# Patient Record
Sex: Male | Born: 2000 | Race: Black or African American | Hispanic: No | Marital: Single | Smoking: Never smoker
Health system: Southern US, Community
[De-identification: ages and names within clinical notes are randomized; demographics above are authoritative.]

---
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
## 2023-05-09 ENCOUNTER — Ambulatory Visit
Admission: EM | Admit: 2023-05-09 | Discharge: 2023-05-09 | Discharge status: Against Medical Advice | Payer: MEDICAID | Attending: Emergency Medicine | Admitting: Emergency Medicine

## 2023-05-09 NOTE — ED Notes (Signed)
 Called pt from lobby x2 & phone x2 unable to reach nor lvm. LWBS before triage.

## 2023-05-12 NOTE — ED Provider Notes (Signed)
 Pt LWBS   Douglas Gong, MD 05/12/23 [phone]
